# Patient Record
Sex: Female | Born: 1955 | Race: White | Hispanic: No | Marital: Married | Smoking: Former smoker
Health system: Southern US, Community
[De-identification: ages and names within clinical notes are randomized; demographics above are authoritative.]

## PROBLEM LIST (undated history)

## (undated) DIAGNOSIS — M549 Dorsalgia, unspecified: Secondary | ICD-10-CM

## (undated) DIAGNOSIS — N3010 Interstitial cystitis (chronic) without hematuria: Secondary | ICD-10-CM

## (undated) DIAGNOSIS — G8929 Other chronic pain: Secondary | ICD-10-CM

## (undated) DIAGNOSIS — E7800 Pure hypercholesterolemia, unspecified: Secondary | ICD-10-CM

## (undated) HISTORY — PX: TUBAL LIGATION: SHX77

## (undated) HISTORY — DX: Dorsalgia, unspecified: M54.9

## (undated) HISTORY — DX: Interstitial cystitis (chronic) without hematuria: N30.10

## (undated) HISTORY — DX: Other chronic pain: G89.29

## (undated) HISTORY — PX: CHOLECYSTECTOMY: SHX55

## (undated) HISTORY — DX: Pure hypercholesterolemia, unspecified: E78.00

---

## 2006-02-04 ENCOUNTER — Ambulatory Visit: Payer: Self-pay | Admitting: Internal Medicine

## 2006-02-07 ENCOUNTER — Ambulatory Visit: Payer: Self-pay | Admitting: Cardiology

## 2008-06-07 ENCOUNTER — Emergency Department (HOSPITAL_BASED_OUTPATIENT_CLINIC_OR_DEPARTMENT_OTHER): Admission: EM | Admit: 2008-06-07 | Discharge: 2008-06-07 | Payer: Self-pay | Admitting: Emergency Medicine

## 2008-11-05 IMAGING — CT CT ABDOMEN W/ CM
2 of 5 series · 16 of 46 positions shown, 18 images · IV contrast (APPLIED)
Comparison: CT 02/07/2006

CT ABDOMEN

CLINICAL DATA: Abdominal pain.

CT ABDOMEN AND PELVIS WITH CONTRAST
TECHNIQUE: Multidetector CT imaging of the abdomen and pelvis was
performed using the standard protocol following bolus
administration of intravenous contrast.

[Series 3: abd/pelvis 2.0 coronal · coronal · 0.66mm/px · 3 of 137 slices shown]
[im 46/137  soft-tissue]
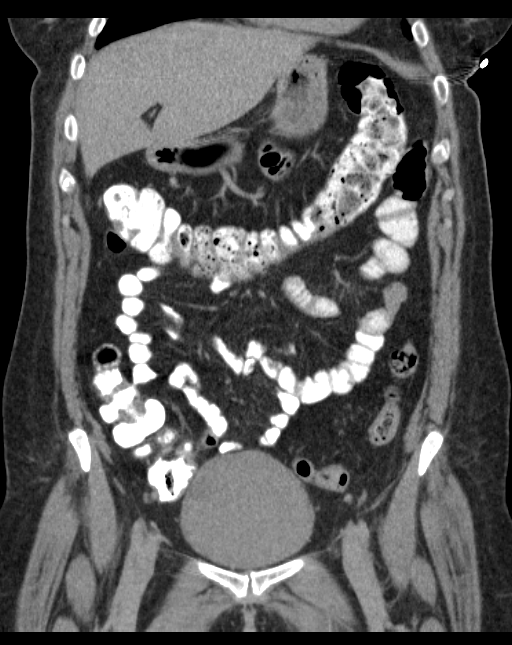
[im 61/137  soft-tissue]
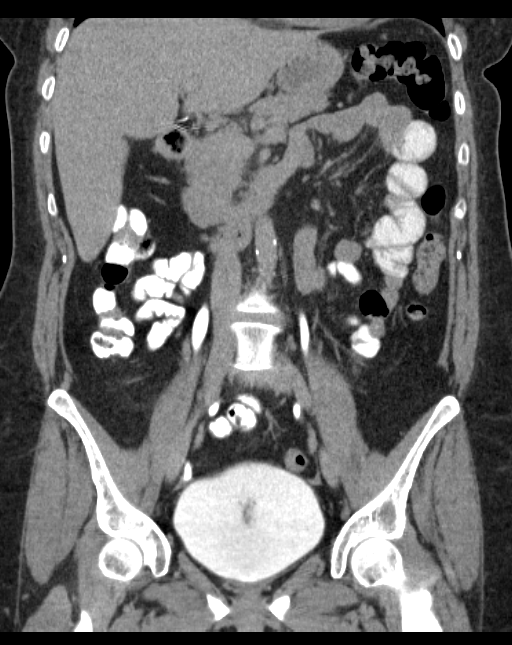
[im 76/137  soft-tissue]
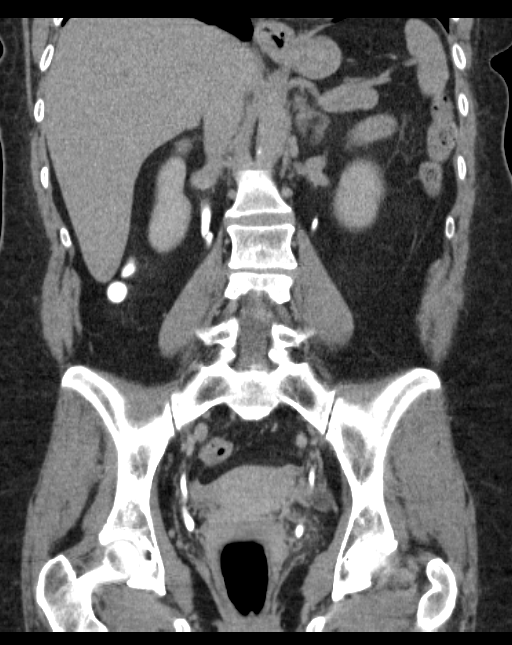

[Series 5: abd/pelvis 1.5 b31f thins · axial · 0.75mm/px · z∈[-264,+91]mm · 13 of 572 slices shown, 15 images]
[im 43/572  soft-tissue]
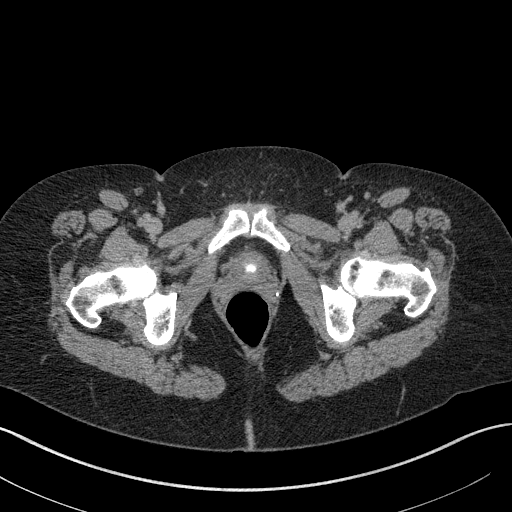
[im 43/572  bone]
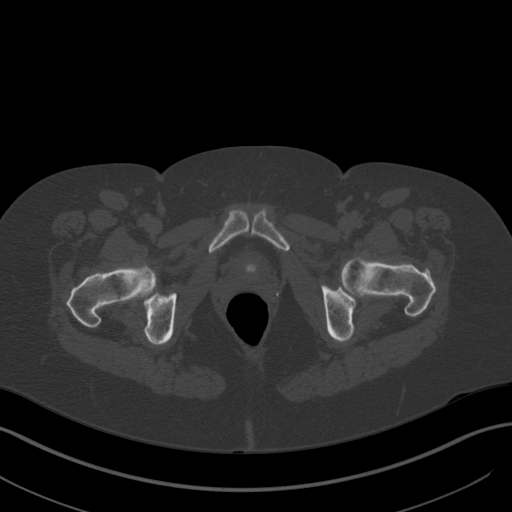
[im 85/572  soft-tissue]
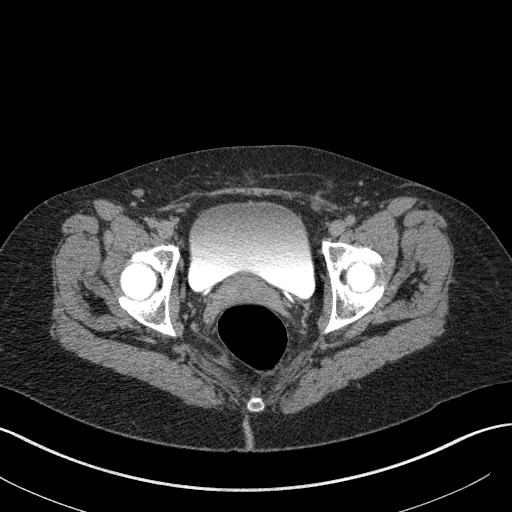
[im 127/572  soft-tissue]
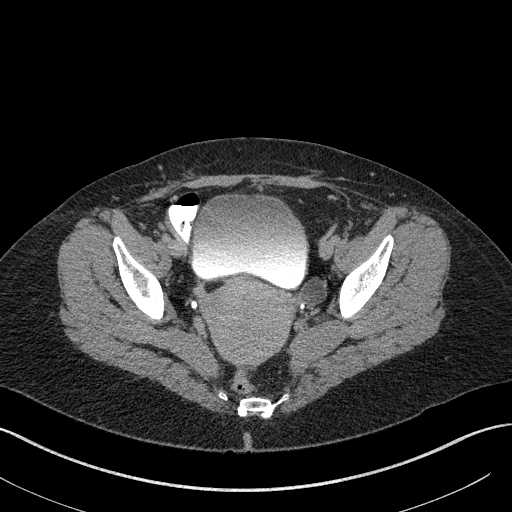
[im 170/572  soft-tissue]
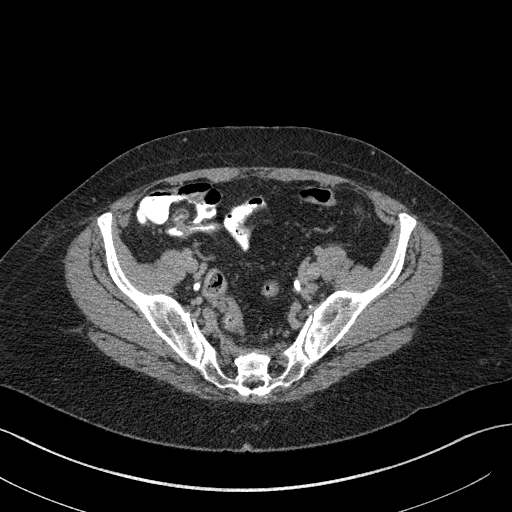
[im 212/572  soft-tissue]
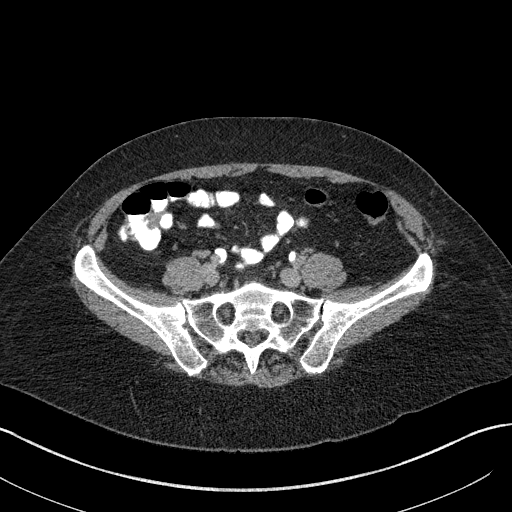
[im 254/572  soft-tissue]
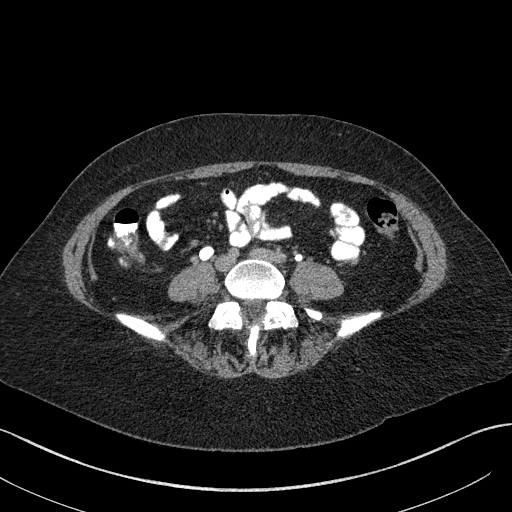
[im 297/572  soft-tissue]
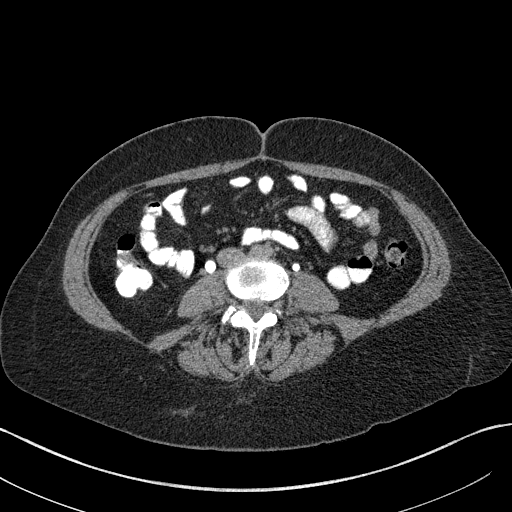
[im 339/572  soft-tissue]
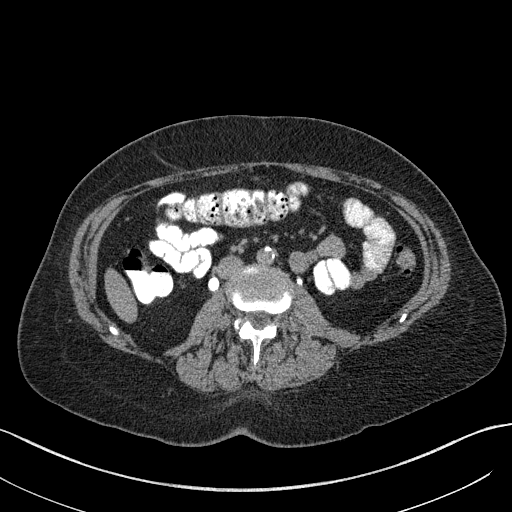
[im 381/572  soft-tissue]
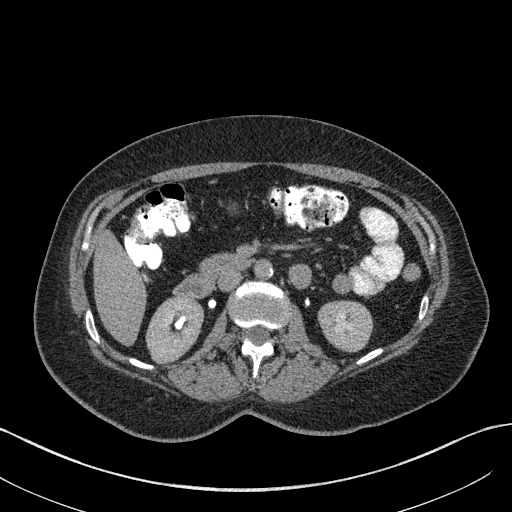
[im 381/572  bone]
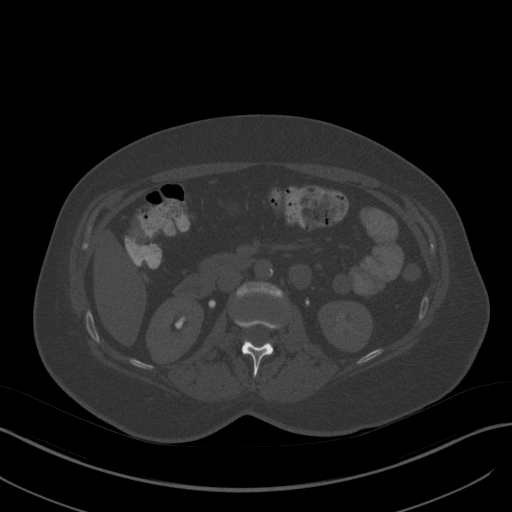
[im 423/572  soft-tissue]
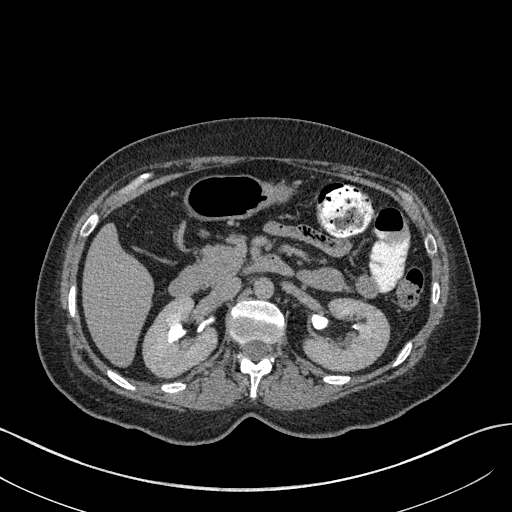
[im 466/572  soft-tissue]
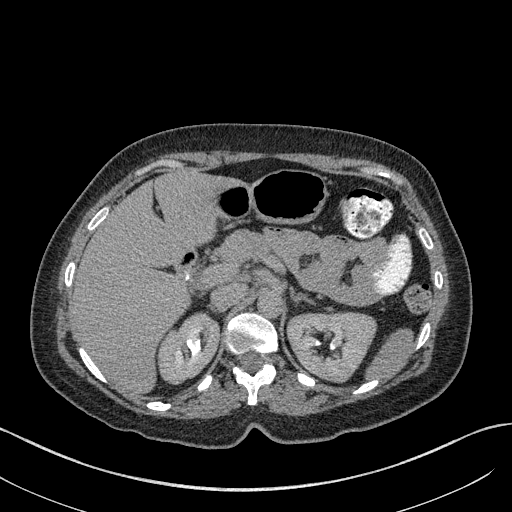
[im 508/572  soft-tissue]
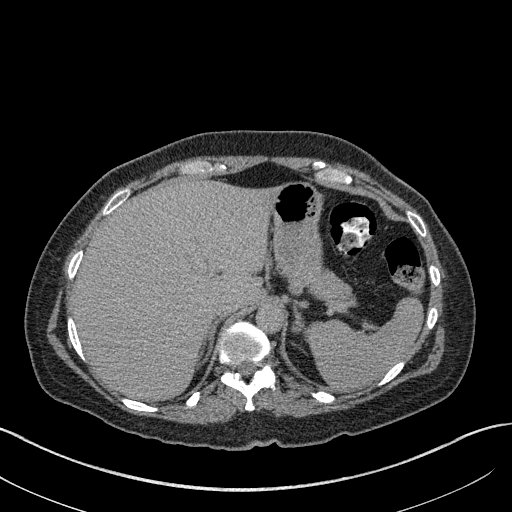
[im 550/572  soft-tissue]
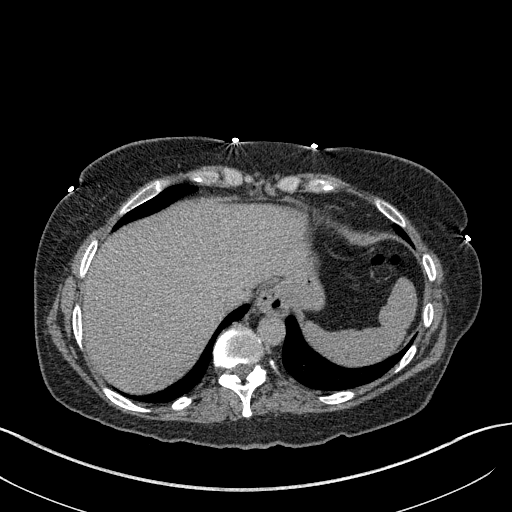

[16 of 46 positions shown; findings below may reference images not displayed]

Contrast: 100 ml Omniscan 300 IV contrast.  The patient complained
of a burning sensation in the arm after initial contrast injection.
The injection was stopped, and the area inspected by the CT
technologist to determine no extravasation had occurred, and
injection was restarted.  Imaging was inadvertently obtained after
the second reinjection attempts due to technical factors of the
scanner.  Therefore, images are not obtained in the optimal portal
venous phase of opacification.
FINDINGS: Cholecystectomy clips again noted.  Visualized liver,
spleen, pancreas, adrenal glands, and kidneys are unremarkable.  No
hydronephrosis.  Accounting for dense opacification by IV contrast,
no filling defect is seen in either ureter, although a small stone
could be obscured.  No ascites or lymphadenopathy.
IMPRESSION: No acute intra-abdominal finding.

CT PELVIS
FINDINGS: Mild atherosclerotic calcification of the normal caliber
abdominal aorta is noted.  Uterus and left ovary are normal.  Right
ovary is not definitely identified; the patient reported a history
of right salpingoophorectomy. No pelvic free fluid or
lymphadenopathy.  The appendix is not identified but no secondary
evidence for acute appendicitis is seen.  No acute osseous finding.
IMPRESSION: No acute intrapelvic finding.

## 2010-12-19 ENCOUNTER — Ambulatory Visit
Admission: RE | Admit: 2010-12-19 | Discharge: 2010-12-19 | Payer: Self-pay | Source: Home / Self Care | Attending: Internal Medicine | Admitting: Internal Medicine

## 2010-12-19 DIAGNOSIS — M549 Dorsalgia, unspecified: Secondary | ICD-10-CM

## 2010-12-19 HISTORY — DX: Dorsalgia, unspecified: M54.9

## 2010-12-29 ENCOUNTER — Ambulatory Visit
Admission: RE | Admit: 2010-12-29 | Discharge: 2010-12-29 | Payer: Self-pay | Source: Home / Self Care | Attending: Internal Medicine | Admitting: Internal Medicine

## 2011-01-03 ENCOUNTER — Encounter: Payer: Self-pay | Admitting: Internal Medicine

## 2011-01-04 NOTE — Assessment & Plan Note (Signed)
Summary: RE-EST ACUTE BACK ISSUE/KN   Vital Signs:  Patient profile:   55 year old female Height:      64 inches (162.56 cm) Weight:      166 pounds (75.45 kg) BMI:     28.60 Temp:     98.4 degrees F (36.89 degrees C) oral BP sitting:   130 / 80  (left arm) Cuff size:   regular  Vitals Entered By: Lucious Groves CMA (December 19, 2010 1:05 PM) CC: Re-est care and acute back pain due to chronic back issues./kb Is Patient Diabetic? No Pain Assessment Patient in pain? 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 This issue was brought on by driving for a long period of time.   History of Present Illness: patient lives in [place], she drove all the way to  [place] to  see her mother who is my patient and is in a nursing home. she has a long history of back pain, she sees pain management, they prescribed OxyContin for her, the last time she had a local injection was in June 2011 at the sacroiliac joint with good relief.  She was doing okay but soon after she arrived to [facility]  noted  intense bilateral lower back pain. It is different to previous exacerbations in the sense that she is also hurting in the  sacral area. It is  similar to previous pains in the sense that she has radiation to the right leg.  ROS No fever No bladder or bowel incontinence No rash in the back No abdominal pain  Current Medications (verified): 1)  Klonopin 1 Mg Tabs (Clonazepam) .... Bid 2)  Lipitor 40 Mg Tabs (Atorvastatin Calcium) .Marland Kitchen.. 1 By Mouth At Bedtime. 3)  Percocet 5-325 Mg Tabs (Oxycodone-Acetaminophen) .... 1/2- 1 Three Times A Day. 4)  Tylenol Arthritis Pain 650 Mg Cr-Tabs (Acetaminophen)  Allergies (verified): 1)  ! Sulfa  Past History:  Past Medical History: Chronic back pain  high cholesterol  Past Surgical History: Cholecystectomy Tubal ligation  Family  History: DM--no CAD-- mother    Social History: tobacco--no ETOH-- no married mother is Ebbie Ridge one of my patients   Physical Exam  General:  alert and well-developed.   Lungs:  normal respiratory effort, no intercostal retractions, no accessory muscle use, and normal breath sounds.   Heart:  normal rate, regular rhythm, and no murmur.   Abdomen:  soft, non-tender, no distention, and no masses.   Msk:  slightly tender in the lower back bilaterally. She does have an antalgic gait and position, she needs  to turn to her left whenever she tries to sit from being lying down in the table. Extremities:  rotation of the hips cause some discomfort bilaterally, worse on the right Neurologic:  alert & oriented X3, strength normal in all extremities, and DTRs symmetrical except for a lack of the right ankle jerk. Straight leg test seems positive bilaterally     Impression & Recommendations:  Problem # 1:  BACK PAIN (ICD-724.5) exacerbation of her chronic back pain, this particular exacerbation is slightly different because she is hurting in the sacral area as well. Sx likely triggered by prolonged car trip from [place] She has an absent right ankle jerk, patient not sure if that is a new finding. Plan: Continue with Percocet steroids x  few days Rest if no better in few  days, she will be to be seen by her especialist in [place] or she would call for a referral to a local orthopedic surgeon  Her updated medication list for this problem includes:    Percocet 5-325 Mg Tabs (Oxycodone-acetaminophen) .Marland Kitchen... 1/2- 1 three times a day.    Tylenol Arthritis Pain 650 Mg Cr-tabs (Acetaminophen)  Complete Medication List: 1)  Klonopin 1 Mg Tabs (Clonazepam) .... Bid 2)  Lipitor 40 Mg Tabs (Atorvastatin calcium) .Marland Kitchen.. 1 by mouth at bedtime. 3)  Percocet 5-325 Mg Tabs (Oxycodone-acetaminophen) .... 1/2- 1 three times a day. 4)  Tylenol Arthritis Pain 650 Mg Cr-tabs (Acetaminophen) 5)  Prednisone 10  Mg Tabs (Prednisone) .... 4 by mouth once daily x 3, 3x3,2x3,1x3  Patient Instructions: 1)  rest  2)  Warm compress 3)  Prednisone as prescribed 4)  call ig no better in 2 weeks Prescriptions: PREDNISONE 10 MG TABS (PREDNISONE) 4 by mouth once daily x 3, 3x3,2x3,1x3  #30 x 0   Entered and Authorized by:   Elita Quick E. Bynum Mccullars MD   Signed by:   Nolon Rod. Luwana Butrick MD on 12/19/2010   Method used:   Print then Give to Patient   RxID:   [id number]    Orders Added: 1)  New Patient Level II [57846]

## 2011-01-10 NOTE — Assessment & Plan Note (Signed)
Summary: LOWER BACK PAIN--STILL HURTS//SPH   Vital Signs:  Patient profile:   55 year old female Weight:      165.13 pounds Pulse rate:   84 / minute Pulse rhythm:   regular BP sitting:   128 / 88  (left arm) Cuff size:   regular  Vitals Entered By: Army Fossa CMA (December 29, 2010 2:07 PM) CC: Pt here to discuss back pain Comments prednisone not helping [facility] [employer] not fasting    History of Present Illness: continue w/ back pain, again is at the "tail bone" which is a different feature compared to her usual pain. was Rx prednisone-- no help   no fever no b/b incontinence ?rash in the bottom-- started 2 days ago, noted a single blister yesterday. no vaginal rash  Current Medications (verified): 1)  Klonopin 1 Mg Tabs (Clonazepam) .... Bid 2)  Lipitor 40 Mg Tabs (Atorvastatin Calcium) .Marland Kitchen.. 1 By Mouth At Bedtime. 3)  Percocet 5-325 Mg Tabs (Oxycodone-Acetaminophen) .... 1/2- 1 Three Times A Day. 4)  Prednisone 10 Mg Tabs (Prednisone) .... 4 By Mouth Once Daily X 3, 3x3,2x3,1x3  Allergies (verified): 1)  ! Sulfa  Past History:  Past Medical History: Reviewed history from 12/19/2010 and no changes required. Chronic back pain  high cholesterol  Past Surgical History: Reviewed history from 12/19/2010 and no changes required. Cholecystectomy Tubal ligation  Social History: Reviewed history from 12/19/2010 and no changes required. tobacco--no ETOH-- no married mother is Ebbie Ridge one of my patients   Physical Exam  General:  alert and well-developed.  no apparent distress Extremities:  rotation of the hips cause no discomfort  Neurologic:  alert & oriented X3, strength normal in all extremities, and DTRs symmetrical , the previously absent right ankle jerk is now  slightly positive. Straight leg test seems positive bilaterally   Skin:  examination of the buttocks-- has a single open blister in the right buttock about 1.5 inch from the anus     Impression & Recommendations:  Problem # 1:  BACK PAIN (ICD-724.5)  Exacerbated back pain. Symptoms started after a long car trip. She has developed a single blister in the buttock, these could represent shingles. Although I am not certain that she has shingles I think that if it  is, today is the golden  opportunity to start treatment. Nevertheless, she needs to see a local back specialist plan: Valtrex Referred to ortho  The following medications were removed from the medication list:    Tylenol Arthritis Pain 650 Mg Cr-tabs (Acetaminophen) Her updated medication list for this problem includes:    Percocet 5-325 Mg Tabs (Oxycodone-acetaminophen) .Marland Kitchen... 1/2- 1 three times a day.  Orders: Orthopedic Referral (Ortho)  Complete Medication List: 1)  Klonopin 1 Mg Tabs (Clonazepam) .... Bid 2)  Lipitor 40 Mg Tabs (Atorvastatin calcium) .Marland Kitchen.. 1 by mouth at bedtime. 3)  Percocet 5-325 Mg Tabs (Oxycodone-acetaminophen) .... 1/2- 1 three times a day. 4)  Valtrex 1 Gm Tabs (Valacyclovir hcl) .... One by mouth 3 times a day for one week Prescriptions: VALTREX 1 GM TABS (VALACYCLOVIR HCL) one by mouth 3 times a day for one week  #21 x 0   Entered and Authorized by:   Nolon Rod. Paz MD   Signed by:   Nolon Rod. Paz MD on 12/29/2010   Method used:   Print then Give to Patient   RxID:   [id number]    Orders Added: 1)  Est. Patient Level III [14782] 2)  Orthopedic Referral [Ortho]

## 2011-01-24 NOTE — Letter (Signed)
Summary: [facility] records  [facility] records   Imported By: Kassie Mends 01/11/2011 11:42:41  _____________________________________________________________________  External Attachment:    Type:   Image     Comment:   External Document  Appended Document: [facility] records please fax notes to the orthopedic doctor  we refered her to in [place]  Appended Document: [facility] records faxed to [place] ortho.

## 2011-03-03 ENCOUNTER — Encounter: Payer: Self-pay | Admitting: Internal Medicine

## 2011-03-05 ENCOUNTER — Ambulatory Visit (INDEPENDENT_AMBULATORY_CARE_PROVIDER_SITE_OTHER): Payer: Self-pay | Admitting: Internal Medicine

## 2011-03-05 ENCOUNTER — Encounter: Payer: Self-pay | Admitting: Internal Medicine

## 2011-03-05 DIAGNOSIS — M549 Dorsalgia, unspecified: Secondary | ICD-10-CM

## 2011-03-05 NOTE — Progress Notes (Signed)
  Subjective:    Patient ID: Kathryn Lang, female    DOB: Mar 18, 1956, 55 y.o.   MRN: [id number]  HPI Saw  Dr. Ethelene Hal  recently, quite dissatisfied with her care overthere  Past Medical History  Diagnosis Date  . Chronic back pain   . High cholesterol    Past Surgical History  Procedure Date  . Cholecystectomy   . Tubal ligation    History   Social History  . Marital Status: Married    Spouse Name: N/A    Number of Children: N/A  . Years of Education: N/A   Occupational History  . Not on file.   Social History Main Topics  . Smoking status: Never Smoker   . Smokeless tobacco: Not on file  . Alcohol Use: No  . Drug Use: Not on file  . Sexually Active: Not on file   Other Topics Concern  . Not on file   Social History Narrative   Mother is Ebbie Ridge one of my patients . Married, 1 child.  Used to live in [place], living in [place] for now        Review of Systems     Objective:   Physical Exam  Constitutional: She appears well-developed and well-nourished.  Musculoskeletal:       Antalgic posture noted  Psychiatric:       Very upset about the recent visit with pain medicine, tearful at times          Assessment & Plan:

## 2011-03-05 NOTE — Assessment & Plan Note (Signed)
Chronic back pain, exacerbated since 1 - 2012 after she drove her car from [place] to [place] We spent around 15 minutes discussing her recent appointment with pain management. She already has an appointment with a physiatrist  in [facility]. All the records available to me were printed and provided to the patient Return to the office as needed

## 2011-03-20 ENCOUNTER — Telehealth: Payer: Self-pay | Admitting: Internal Medicine

## 2011-03-20 MED ORDER — OXYCODONE-ACETAMINOPHEN 5-325 MG PO TABS: ORAL_TABLET | ORAL | Status: DC

## 2011-03-20 MED ORDER — CLONAZEPAM 1 MG PO TABS: 1.0000 mg | ORAL_TABLET | Freq: Two times a day (BID) | ORAL | Status: DC | PRN

## 2011-03-20 NOTE — Telephone Encounter (Signed)
Left detailed message for pt that rx's were up front for her to pick up.

## 2011-03-20 NOTE — Telephone Encounter (Signed)
Called pharmacy for refill---they said she had to call Dr's office----she needs refills on Oxycodone and Klonopin----she is here in town visiting her parents ---doesn't know if Dr Drue Novel would need to see her to refill these medications or whether she can come and pick them up---please call and advise

## 2011-03-20 NOTE — Telephone Encounter (Signed)
Ok RF 1 month on each

## 2011-03-20 NOTE — Telephone Encounter (Signed)
Dr. Drue Novel please advise according to note from 12/19/10 pt lives in [place].

## 2011-04-24 ENCOUNTER — Encounter: Payer: Self-pay | Admitting: Internal Medicine

## 2011-04-24 ENCOUNTER — Ambulatory Visit (INDEPENDENT_AMBULATORY_CARE_PROVIDER_SITE_OTHER): Payer: Managed Care, Other (non HMO) | Admitting: Internal Medicine

## 2011-04-24 VITALS — BP 126/82 | HR 80 | Wt 164.6 lb

## 2011-04-24 DIAGNOSIS — E785 Hyperlipidemia, unspecified: Secondary | ICD-10-CM | POA: Insufficient documentation

## 2011-04-24 DIAGNOSIS — F419 Anxiety disorder, unspecified: Secondary | ICD-10-CM | POA: Insufficient documentation

## 2011-04-24 DIAGNOSIS — F411 Generalized anxiety disorder: Secondary | ICD-10-CM

## 2011-04-24 DIAGNOSIS — M549 Dorsalgia, unspecified: Secondary | ICD-10-CM

## 2011-04-24 MED ORDER — CLONAZEPAM 1 MG PO TABS: 1.0000 mg | ORAL_TABLET | Freq: Two times a day (BID) | ORAL | Status: DC | PRN

## 2011-04-24 MED ORDER — OXYCODONE-ACETAMINOPHEN 5-325 MG PO TABS: ORAL_TABLET | ORAL | Status: DC

## 2011-04-24 NOTE — Assessment & Plan Note (Addendum)
See review of systems. Plan is to continue working with Dr. Mariel Craft Come back in 4 months, will refill her pain medications monthly.

## 2011-04-24 NOTE — Assessment & Plan Note (Signed)
Symptoms well controlled so far. Refill meds

## 2011-04-24 NOTE — Assessment & Plan Note (Addendum)
Self discontinued Lipitor 02-2011, she was afraid of side effects and also read that Lipitor may increase her pain. Reassess in 3 months

## 2011-04-24 NOTE — Progress Notes (Signed)
  Subjective:    Patient ID: Kathryn Lang, female    DOB: [birth year], 55 y.o.   MRN: [id number]  HPI  routine office visit.  Past Medical History  Diagnosis Date  . Chronic back pain   . High cholesterol   . BACK PAIN 12/19/2010   Past Surgical History  Procedure Date  . Cholecystectomy   . Tubal ligation       Review of Systems Since the last time she was here, she saw Dr. Mariel Craft He did a local injection, it helped for a day, she had a followup last week, they did a  MRI. Further therapy will be dictated by results. She self discontinued Lipitor, she is afraid of side effects and afraid it may increase her back pain. She has been more active, trying to lose wt with low impact exercise and yoga. Takes an average of 3 OxyContin daily and to Klonopin daily.     Objective:   Physical Exam Alert, oriented x3 Lungs clear to voltage bilaterally. Cardiovascular, regular rhythm without a murmur. Orthopedic, she does have a antalgic gait and posture. She gets emotional whenever she talks about her pain and her inability to work.       Assessment & Plan:

## 2011-05-23 ENCOUNTER — Ambulatory Visit (INDEPENDENT_AMBULATORY_CARE_PROVIDER_SITE_OTHER): Payer: Managed Care, Other (non HMO) | Admitting: Internal Medicine

## 2011-05-23 ENCOUNTER — Encounter: Payer: Self-pay | Admitting: Internal Medicine

## 2011-05-23 DIAGNOSIS — M549 Dorsalgia, unspecified: Secondary | ICD-10-CM

## 2011-05-23 MED ORDER — OXYCODONE-ACETAMINOPHEN 5-325 MG PO TABS: ORAL_TABLET | ORAL | Status: DC

## 2011-05-23 NOTE — Progress Notes (Signed)
  Subjective:    Patient ID: Kathryn Lang, female    DOB: [birth year], 55 y.o.   MRN: [id number]  HPI Routine visit, she had an MRI recently, they didn't find anything that could be operated on consequently she needs medical management. She was prescribed etodolac and she has been taking it for the last few days, no obvious relief. Wonders if a Land or acupuncture would be appropriate.  Past Medical History  Diagnosis Date  . Chronic back pain   . High cholesterol   . BACK PAIN 12/19/2010    Past Surgical History  Procedure Date  . Cholecystectomy   . Tubal ligation      Review of Systems She also has some anxiety, she is afraid people would think that she is "abusing" her pain medicine. At the same time, he is going to a Librarian, academic which helps a great deal. Denies any GI side effects from etodolac.       Objective:   Physical Exam Alert oriented, no physical distress, she gets emotional when she talks about her pain.       Assessment & Plan:

## 2011-05-23 NOTE — Patient Instructions (Signed)
Please contact Dr Christell Faith  [address] [place], [place], [place] [address] [phone]

## 2011-05-23 NOTE — Assessment & Plan Note (Addendum)
Recent MRI did not show anything that couldn't be helped by surgery. Plan: Continue with oxycodone, prescription provided. Continue with etodolac, GI side effects discussed, if she finds this medication helpful she will let me know. I agreed that a trial with a chiropractor is a positive step. See instructions. She is somehow anxious about the use of painkillers, I reassured her, encouraged to focus on getting better, remain active. F2F 15 min, > 50 % counseling.

## 2011-06-19 ENCOUNTER — Other Ambulatory Visit: Payer: Self-pay | Admitting: Internal Medicine

## 2011-06-19 NOTE — Telephone Encounter (Signed)
Ok 90, no RF 

## 2011-06-20 MED ORDER — OXYCODONE-ACETAMINOPHEN 5-325 MG PO TABS: ORAL_TABLET | ORAL | Status: DC

## 2011-06-20 NOTE — Telephone Encounter (Signed)
Pt is aware.  

## 2011-07-24 ENCOUNTER — Other Ambulatory Visit: Payer: Self-pay | Admitting: Internal Medicine

## 2011-07-24 MED ORDER — OXYCODONE-ACETAMINOPHEN 5-325 MG PO TABS: ORAL_TABLET | ORAL | Status: DC

## 2011-07-24 NOTE — Telephone Encounter (Signed)
LMOM to inform Patient Rx ready for p/u.

## 2011-07-24 NOTE — Telephone Encounter (Signed)
Rx printed for signature. 

## 2011-07-24 NOTE — Telephone Encounter (Signed)
90, no RF 

## 2011-07-24 NOTE — Telephone Encounter (Signed)
Oxycodone-APAP 5-325 requested [last refilled 06/20/11] Please see note below RE: passing of mother

## 2011-07-24 NOTE — Telephone Encounter (Signed)
Refill oxycodone 5/325 -patient will pick up wed - [id number] ---FYI patient mom passed away sat [phone]

## 2011-07-25 ENCOUNTER — Ambulatory Visit: Payer: Managed Care, Other (non HMO) | Admitting: Internal Medicine

## 2011-08-10 ENCOUNTER — Ambulatory Visit (INDEPENDENT_AMBULATORY_CARE_PROVIDER_SITE_OTHER): Payer: Managed Care, Other (non HMO) | Admitting: Family

## 2011-08-10 ENCOUNTER — Encounter: Payer: Self-pay | Admitting: Family

## 2011-08-10 VITALS — BP 120/82 | HR 78 | Temp 97.8°F | Resp 16 | Wt 163.1 lb

## 2011-08-10 DIAGNOSIS — H60399 Other infective otitis externa, unspecified ear: Secondary | ICD-10-CM

## 2011-08-10 DIAGNOSIS — H6010 Cellulitis of external ear, unspecified ear: Secondary | ICD-10-CM | POA: Insufficient documentation

## 2011-08-10 DIAGNOSIS — L03818 Cellulitis of other sites: Secondary | ICD-10-CM

## 2011-08-10 MED ORDER — DOXYCYCLINE HYCLATE 100 MG PO TABS: 100.0000 mg | ORAL_TABLET | Freq: Two times a day (BID) | ORAL | Status: DC

## 2011-08-10 MED ORDER — FLUCONAZOLE 150 MG PO TABS: ORAL_TABLET | ORAL | Status: AC

## 2011-08-10 NOTE — Patient Instructions (Addendum)
chlorhexadine soap (OTC)- apply to skin and leave on for 10 minutes then rinse- do this once daily for 7 days.  Keep your upcoming appointment with Dr. Drue Novel. Call if increased redness, swelling, fever, or if symptoms are improved in 2-3 days or if not completely resolved in 1 week.

## 2011-08-10 NOTE — Progress Notes (Signed)
  Subjective:    Patient ID: Kathryn Lang, female    DOB: 1956-03-07, 55 y.o.   MRN: [id number]  HPI  Kathryn Lang is a 55 yr old female who presents today with chief complaint of ear pain.  Notes pre-auricular lesion since childhood, "picked at it 2 months a go."  Yesterday she woke up an noted lesion inside the right "flap of ear." has tried antibiotic ointment.  She tried a z-pak 7 weeks ago with brief improvement for 2 weeks.  She now has tenderness beneath jaw and pain behind the right ear. Denies fever.  She also thinks that she has a swollen gland beneath the right collar bone.     Review of Systems Past Medical History  Diagnosis Date  . Chronic back pain   . High cholesterol   . BACK PAIN 12/19/2010    History   Social History  . Marital Status: Married    Spouse Name: N/A    Number of Children: N/A  . Years of Education: N/A   Occupational History  . Not on file.   Social History Main Topics  . Smoking status: Never Smoker   . Smokeless tobacco: Not on file  . Alcohol Use: No  . Drug Use: Not on file  . Sexually Active: Not on file   Other Topics Concern  . Not on file   Social History Narrative   Mother is Ebbie Ridge one of my patients . Married, 1 child.  Used to live in [place], living in [place] for now     Past Surgical History  Procedure Date  . Cholecystectomy   . Tubal ligation     Family History  Problem Relation Age of Onset  . Diabetes Neg Hx   . Coronary artery disease Mother     Allergies  Allergen Reactions  . Sulfonamide Derivatives     REACTION: tongue and lips swelling    Current Outpatient Prescriptions on File Prior to Visit  Medication Sig Dispense Refill  . clonazePAM (KLONOPIN) 1 MG tablet Take 1 tablet (1 mg total) by mouth 2 (two) times daily as needed.  60 tablet  4  . oxyCODONE-acetaminophen (PERCOCET) 5-325 MG per tablet 1/2-1 three times a day.  90 tablet  0    BP 120/82  Pulse 78  Temp(Src) 97.8 F (36.6 C) (Oral)  Resp 16   Wt 163 lb 1.3 oz (73.973 kg)       Objective:   Physical Exam  Constitutional: She appears well-developed and well-nourished.  HENT:       Small area of swelling inside top of right pinna.  Mild inflammation of R ear canal.  No significant erythema of face.  Neck:       Mild right sided cervical LAD.          Assessment & Plan:

## 2011-08-10 NOTE — Assessment & Plan Note (Signed)
Mild cellulitis.  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  I have recommended that she try using a chlorhexadine wash as outlined in pt instructions to help clear her skin colonization.  She is instructed to call for follow up as outlined in pt instructions.  She is also asking for an rx for diflucan in case she develops a yeast infection as she is headed to [place] today.  Rx sent.

## 2011-08-12 ENCOUNTER — Emergency Department (HOSPITAL_BASED_OUTPATIENT_CLINIC_OR_DEPARTMENT_OTHER)
Admission: EM | Admit: 2011-08-12 | Discharge: 2011-08-12 | Disposition: A | Discharge status: Home / Self Care | Payer: Managed Care, Other (non HMO) | Attending: Emergency Medicine | Admitting: Emergency Medicine

## 2011-08-12 ENCOUNTER — Encounter (HOSPITAL_BASED_OUTPATIENT_CLINIC_OR_DEPARTMENT_OTHER): Payer: Self-pay

## 2011-08-12 DIAGNOSIS — E7800 Pure hypercholesterolemia, unspecified: Secondary | ICD-10-CM | POA: Insufficient documentation

## 2011-08-12 DIAGNOSIS — M542 Cervicalgia: Secondary | ICD-10-CM | POA: Insufficient documentation

## 2011-08-12 DIAGNOSIS — M25519 Pain in unspecified shoulder: Secondary | ICD-10-CM | POA: Insufficient documentation

## 2011-08-12 DIAGNOSIS — H6690 Otitis media, unspecified, unspecified ear: Secondary | ICD-10-CM

## 2011-08-12 MED ORDER — LORATADINE 10 MG PO TABS: 10.0000 mg | ORAL_TABLET | Freq: Every day | ORAL | Status: AC

## 2011-08-12 MED ORDER — AMOXICILLIN 500 MG PO CAPS: 1000.0000 mg | ORAL_CAPSULE | Freq: Two times a day (BID) | ORAL | Status: AC

## 2011-08-12 NOTE — ED Notes (Signed)
Pt states that she has R severe ear pain which is radiating down to her R neck and shoulder.  Pt states that she is going to be traveling to the beach later today and she wants to be sure that she does not "have anything serious".  Pt states that she does not feel that she can take doxycycline which was prescribed by PCP office on 9/7.  Pt states that she was not breathing well, chills, and "raspy throat".  Pt in nad at this time.

## 2011-08-12 NOTE — ED Provider Notes (Signed)
History     CSN: [id number] Arrival date & time: 08/12/2011  5:15 AM  Chief Complaint  Patient presents with  . Otalgia  . Neck Pain  . Shoulder Pain   HPI Comments: Patient states over the past week she has been seen for right ear pain. She was seen by primary care physician and was treated as a presumed staph infection and she had a small rash. She states the rash is cleared up. She's been taking doxycycline fears she is having a reaction and she's developed a sore throat, right neck and shoulder pain, abnormal breathing. She states she has some chills. States she is leaving for vacation soon and wants to make sure there is nothing wrong with her. She has no nausea or vomiting. Pain is improved with medication she has a home.  Patient is a 55 y.o. female presenting with ear pain. The history is provided by the patient. No language interpreter was used.  Otalgia This is a new problem. The current episode started 6 to 12 hours ago. There is pain in the right ear. The problem occurs constantly. The problem has been gradually worsening. There has been no fever. The pain is moderate. Associated symptoms include rhinorrhea, sore throat and neck pain. Pertinent negatives include no ear discharge, no headaches, no hearing loss, no abdominal pain, no diarrhea, no vomiting, no cough and no rash. Her past medical history does not include chronic ear infection.    Past Medical History  Diagnosis Date  . Chronic back pain   . High cholesterol   . BACK PAIN 12/19/2010    Past Surgical History  Procedure Date  . Cholecystectomy   . Tubal ligation   . Cesarean section     Family History  Problem Relation Age of Onset  . Diabetes Neg Hx   . Coronary artery disease Mother     History  Substance Use Topics  . Smoking status: Former Smoker -- 1.0 packs/day for 30 years    Types: Cigarettes    Quit date: 08/11/1998  . Smokeless tobacco: Not on file  . Alcohol Use: No    OB History    Grav  Para Term Preterm Abortions TAB SAB Ect Mult Living                  Review of Systems  Constitutional: Positive for chills. Negative for fever, activity change, appetite change and fatigue.  HENT: Positive for ear pain, congestion, sore throat, rhinorrhea and neck pain. Negative for hearing loss, neck stiffness, sinus pressure and ear discharge.   Respiratory: Negative for cough, shortness of breath and wheezing.   Cardiovascular: Negative for chest pain and palpitations.  Gastrointestinal: Negative for nausea, vomiting, abdominal pain and diarrhea.  Genitourinary: Negative for dysuria, urgency, frequency and flank pain.  Skin: Negative for rash and wound.  Neurological: Negative for dizziness, weakness, light-headedness, numbness and headaches.  All other systems reviewed and are negative.    Physical Exam  BP 124/84  Pulse 73  Temp(Src) 97.8 F (36.6 C) (Oral)  Resp 14  Ht 5\' 4"  (1.626 m)  Wt 163 lb (73.936 kg)  BMI 27.98 kg/m2  SpO2 98%  Physical Exam  Nursing note and vitals reviewed. Constitutional: She is oriented to person, place, and time. She appears well-developed and well-nourished. No distress.  HENT:  Head: Normocephalic and atraumatic.  Right Ear: External ear normal. Tympanic membrane is injected. Tympanic membrane is not erythematous and not bulging. A middle ear effusion is  present.  Left Ear: External ear normal.  Mouth/Throat: Mucous membranes are normal. Posterior oropharyngeal erythema present. No oropharyngeal exudate, posterior oropharyngeal edema or tonsillar abscesses.  Neck: Normal range of motion. Neck supple.  Cardiovascular: Normal rate, regular rhythm, normal heart sounds and intact distal pulses.  Exam reveals no gallop and no friction rub.   No murmur heard. Pulmonary/Chest: Effort normal and breath sounds normal. No respiratory distress.  Abdominal: Soft. Bowel sounds are normal. There is no tenderness.  Musculoskeletal: Normal range of  motion. She exhibits no tenderness.  Lymphadenopathy:    She has no cervical adenopathy.  Neurological: She is alert and oriented to person, place, and time.  Skin: Skin is warm and dry.       Resolving rash the right preauricular area. This is not consistent with shingles.    ED Course  Procedures  MDM Viral illness v allergic rhinitis; serous otitis media There was some injection of the right tympanic membrane. I do not feel that this is a full otitis media at this time however since the patient is leaving town I wrote a prescription for amoxicillin if her symptoms worsen. Recommended decongestants or antihistamines for a serous otitis media. I reassured the patient that the symptoms are not severe. She has no signs of shingles in this dermatome. Patient wishes to stop the doxycycline. I explained that she should complete the course. I advised her that this is not allergic reaction but rather seasonal allergies versus an early viral illness. Patient provided instructions for short-term emergency department. She was instructed to followup with her primary care physician in one week      Dayton Bailiff, MD 08/12/11 [phone]

## 2011-08-20 ENCOUNTER — Encounter: Payer: Self-pay | Admitting: Internal Medicine

## 2011-08-20 ENCOUNTER — Ambulatory Visit (INDEPENDENT_AMBULATORY_CARE_PROVIDER_SITE_OTHER): Payer: Managed Care, Other (non HMO) | Admitting: Internal Medicine

## 2011-08-20 VITALS — BP 112/88 | Temp 98.3°F | Wt 167.0 lb

## 2011-08-20 DIAGNOSIS — H6010 Cellulitis of external ear, unspecified ear: Secondary | ICD-10-CM

## 2011-08-20 DIAGNOSIS — M549 Dorsalgia, unspecified: Secondary | ICD-10-CM

## 2011-08-20 DIAGNOSIS — H60399 Other infective otitis externa, unspecified ear: Secondary | ICD-10-CM

## 2011-08-20 MED ORDER — OXYCODONE-ACETAMINOPHEN 5-325 MG PO TABS: ORAL_TABLET | ORAL | Status: DC

## 2011-08-20 NOTE — Assessment & Plan Note (Signed)
Relatively well controlled with painkillers, she self discontinued etodolac

## 2011-08-20 NOTE — Assessment & Plan Note (Addendum)
Was seen twice, see history of present illness. No evidence of otitis media, pharyngitis or sinusitis. No dental infection. She still has some ill-defined pain on the right side of the neck, TMJ?.  Recommend observation for now. Avoid heavy chewing . See instructions

## 2011-08-20 NOTE — Patient Instructions (Signed)
Finish amoxicillin, avoid heavy chewing, ice pack at the TMJ area. Let us know if not completely well in 2 weeks

## 2011-08-20 NOTE — Progress Notes (Signed)
  Subjective:    Patient ID: Kathryn Lang, female    DOB: [birth year], 55 y.o.   MRN: [id number]  HPI She was recently seen at another office and subsequently at the ER. Was seen with apparently cellulitis in the ear, initially prescribed doxycycline and then switched to amoxicillin. Overall better, still has a ill defined discomfort around the right ear, discomfort extends to the right neck.  Past Medical History  Diagnosis Date  . Chronic back pain   . High cholesterol   . BACK PAIN 12/19/2010   Past Surgical History  Procedure Date  . Cholecystectomy   . Tubal ligation   . Cesarean section     Review of Systems No fever, no ear discharge. No sore throat per se. Was also recently seen by her dentist, no dental issues at the present time Back pain relatively well controlled with percocet, she used to take Etodolac but not doing that anymore.     Objective:   Physical Exam  Constitutional: She appears well-developed and well-nourished. No distress.  HENT:  Head: Normocephalic and atraumatic.  Right Ear: External ear normal.  Left Ear: External ear normal.  Mouth/Throat: Oropharynx is clear and moist. No oropharyngeal exudate.       TMJ left normal TMJ on the right is slightly tender to pressure.   Neck:       Mild tenderness without mass at the angle of the jaw on the right otherwise neck is normal  Cardiovascular: Normal rate, regular rhythm and normal heart sounds.   No murmur heard. Pulmonary/Chest: Effort normal and breath sounds normal. No respiratory distress. She has no wheezes. She has no rales.  Skin: She is not diaphoretic.          Assessment & Plan:

## 2011-08-24 ENCOUNTER — Ambulatory Visit: Payer: Managed Care, Other (non HMO) | Admitting: Internal Medicine

## 2011-08-30 ENCOUNTER — Telehealth: Payer: Self-pay | Admitting: *Deleted

## 2011-08-30 LAB — URINALYSIS, ROUTINE W REFLEX MICROSCOPIC
Hgb urine dipstick: NEGATIVE
Nitrite: NEGATIVE
Specific Gravity, Urine: 1.013
Urobilinogen, UA: 0.2
pH: 5.5

## 2011-08-30 LAB — GC/CHLAMYDIA PROBE AMP, GENITAL
Chlamydia, DNA Probe: NEGATIVE
GC Probe Amp, Genital: NEGATIVE

## 2011-08-30 LAB — WET PREP, GENITAL
Clue Cells Wet Prep HPF POC: NONE SEEN
Yeast Wet Prep HPF POC: NONE SEEN

## 2011-08-30 NOTE — Telephone Encounter (Signed)
Patient requesting a post dated Printed script [09/25/11] for Oxycodone-APAP, as she will be leaving Friday to go out of town with brother for the next month.

## 2011-08-31 MED ORDER — OXYCODONE-ACETAMINOPHEN 5-325 MG PO TABS: ORAL_TABLET | ORAL | Status: DC

## 2011-08-31 NOTE — Telephone Encounter (Signed)
Rx Done. Maury Dus MD signature] Patient Informed.

## 2011-08-31 NOTE — Telephone Encounter (Signed)
Ok to do

## 2011-09-25 ENCOUNTER — Other Ambulatory Visit: Payer: Self-pay | Admitting: Internal Medicine

## 2011-09-25 NOTE — Telephone Encounter (Signed)
Ok 60, 2 RF 

## 2011-09-25 NOTE — Telephone Encounter (Signed)
Last OV 08/20/11. Last Rx 04/24/11

## 2011-10-15 ENCOUNTER — Other Ambulatory Visit: Payer: Self-pay | Admitting: Internal Medicine

## 2011-10-16 MED ORDER — OXYCODONE-ACETAMINOPHEN 5-325 MG PO TABS: ORAL_TABLET | ORAL | Status: DC

## 2011-10-16 NOTE — Telephone Encounter (Signed)
Pt aware Rx is ready to pick up

## 2011-10-16 NOTE — Telephone Encounter (Signed)
Last OV 08/20/11. Last filled 08/31/11

## 2011-10-16 NOTE — Telephone Encounter (Signed)
Ok 90, no RF 

## 2013-11-23 ENCOUNTER — Ambulatory Visit (INDEPENDENT_AMBULATORY_CARE_PROVIDER_SITE_OTHER): Payer: Managed Care, Other (non HMO) | Admitting: Internal Medicine

## 2013-11-23 ENCOUNTER — Encounter: Payer: Self-pay | Admitting: Internal Medicine

## 2013-11-23 VITALS — BP 113/74 | HR 65 | Temp 98.3°F | Wt 155.0 lb

## 2013-11-23 DIAGNOSIS — M549 Dorsalgia, unspecified: Secondary | ICD-10-CM

## 2013-11-23 DIAGNOSIS — N3010 Interstitial cystitis (chronic) without hematuria: Secondary | ICD-10-CM | POA: Insufficient documentation

## 2013-11-23 DIAGNOSIS — E785 Hyperlipidemia, unspecified: Secondary | ICD-10-CM

## 2013-11-23 LAB — COMPREHENSIVE METABOLIC PANEL
AST: 22 U/L (ref 0–37)
Albumin: 4.5 g/dL (ref 3.5–5.2)
Alkaline Phosphatase: 63 U/L (ref 39–117)
BUN: 12 mg/dL (ref 6–23)
Creatinine, Ser: 0.6 mg/dL (ref 0.4–1.2)
Potassium: 3.7 mEq/L (ref 3.5–5.1)
Total Bilirubin: 0.5 mg/dL (ref 0.3–1.2)

## 2013-11-23 LAB — LIPID PANEL
Cholesterol: 158 mg/dL (ref 0–200)
HDL: 65.7 mg/dL (ref >39.00)
LDL Cholesterol: 68 mg/dL (ref 0–99)
VLDL: 24.4 mg/dL (ref 0.0–40.0)

## 2013-11-23 NOTE — Assessment & Plan Note (Signed)
To see a specialist in [place] soon, currently managing pain with pain medication, see "back pain"

## 2013-11-23 NOTE — Progress Notes (Signed)
   Subjective:    Patient ID: Kathryn Lang, female    DOB: Mar 21, 1956, 57 y.o.   MRN: [id number]  HPI Here for the management of cholesterol and pain I used to see her regularly until she  moved to [place] 2 years ago, is in [place]  temporarily to see a especialist in interstitial cystitis (in [place]). A month ago was diagnosed with  high cholesterol and since then is taking Crestor. Continue with back pain, managed by a specialist in [place], she will need me to refill one or 2 prescriptions while she is in town.  Past Medical History  Diagnosis Date  . Chronic back pain   . High cholesterol   . BACK PAIN 12/19/2010  . Interstitial cystitis    Past Surgical History  Procedure Laterality Date  . Cholecystectomy    . Tubal ligation    . Cesarean section     History   Social History  . Marital Status: Married    Spouse Name: N/A    Number of Children: 1  . Years of Education: N/A   Occupational History  . not working    Social History Main Topics  . Smoking status: Former Smoker -- 1.00 packs/day for 30 years    Types: Cigarettes    Quit date: 08/11/1998  . Smokeless tobacco: Never Used  . Alcohol Use: No  . Drug Use: No  . Sexual Activity: Not on file   Other Topics Concern  . Not on file   Social History Narrative   Mother is Kathryn Lang one of my patients .    Married, 1 child.          Review of Systems Since she started cholesterol medication, aches and pains are not worse. Occasionally has nausea at the end of the day, no vomiting no diarrhea. Diet is poor, unable to exercise. No CP-SOB    Objective:   Physical Exam BP 113/74  Pulse 65  Temp(Src) 98.3 F (36.8 C)  Wt 155 lb (70.308 kg)  SpO2 93% General -- alert, well-developed, NAD.   Lungs -- normal respiratory effort, no intercostal retractions, no accessory muscle use, and normal breath sounds.  Heart-- normal rate, regular rhythm, no murmur.  Extremities-- no pretibial edema bilaterally    Psych-- Cognition and judgment appear intact. Cooperative with normal attention span and concentration. No anxious appearing , no depressed appearing.     Assessment & Plan:

## 2013-11-23 NOTE — Patient Instructions (Addendum)
Get your blood work before you leave  Next visit for a  follow up , fasting, in 3 months  Please make an appointment     If you need more information about diet and cholesterol, the American [employer] Association is a great resource online at:  [employer]

## 2013-11-23 NOTE — Assessment & Plan Note (Signed)
Labs done elsewhere 10/23/2013: Total cholesterol 401, HDL 67, triglycerides 355, LDL 263. Since then she's taking Crestor regularly. Plan: Labs Encourage healthier diet. Patient unable to exercise due to pain

## 2013-11-23 NOTE — Progress Notes (Signed)
Pre visit review using our clinic review tool, if applicable. No additional management support is needed unless otherwise documented below in the visit note. 

## 2013-11-23 NOTE — Assessment & Plan Note (Addendum)
Chronic back pain, on pain medication prescribed by a pain management specialist in [facility] [place], [place], phone [phone]) She intends to return to them in 2-3 months, in the meantime will call here for prescription.

## 2013-11-25 ENCOUNTER — Encounter: Payer: Self-pay | Admitting: *Deleted

## 2013-12-21 ENCOUNTER — Telehealth: Payer: Self-pay | Admitting: *Deleted

## 2013-12-21 NOTE — Telephone Encounter (Signed)
Patient called and requested refill on percocet and crestor  Last filled-10/16/2011-percocet Last seen-11/23/2013 [facility]-not on file, contract not signed   Please advised. SW

## 2013-12-21 NOTE — Telephone Encounter (Signed)
Okay Percocet #90, no refills (please verify the sig) Okay Crestor #30 and 3 refills

## 2013-12-22 ENCOUNTER — Other Ambulatory Visit: Payer: Self-pay | Admitting: *Deleted

## 2013-12-22 MED ORDER — OXYCODONE-ACETAMINOPHEN 5-325 MG PO TABS: 1.0000 | ORAL_TABLET | Freq: Four times a day (QID) | ORAL | Status: AC | PRN

## 2013-12-22 MED ORDER — OXYCODONE-ACETAMINOPHEN 7.5-325 MG PO TABS: 1.0000 | ORAL_TABLET | Freq: Four times a day (QID) | ORAL | Status: AC | PRN

## 2013-12-22 MED ORDER — ROSUVASTATIN CALCIUM 20 MG PO TABS: 20.0000 mg | ORAL_TABLET | Freq: Every day | ORAL | Status: AC

## 2013-12-22 NOTE — Telephone Encounter (Signed)
Sig verified and Rx printed

## 2014-02-22 ENCOUNTER — Ambulatory Visit (INDEPENDENT_AMBULATORY_CARE_PROVIDER_SITE_OTHER): Payer: Managed Care, Other (non HMO) | Admitting: Internal Medicine

## 2014-02-22 DIAGNOSIS — M549 Dorsalgia, unspecified: Secondary | ICD-10-CM

## 2014-02-22 NOTE — Progress Notes (Signed)
Pre visit review using our clinic review tool, if applicable. No additional management support is needed unless otherwise documented below in the visit note.
